# Patient Record
Sex: Male | Born: 1985 | Race: Black or African American | Hispanic: No | Marital: Single | Smoking: Current every day smoker
Health system: Southern US, Community
[De-identification: ages and names within clinical notes are randomized; demographics above are authoritative.]

---
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
## 2018-03-09 ENCOUNTER — Encounter (HOSPITAL_COMMUNITY): Payer: Self-pay | Admitting: *Deleted

## 2018-03-09 ENCOUNTER — Ambulatory Visit (HOSPITAL_COMMUNITY)
Admission: EM | Admit: 2018-03-09 | Discharge: 2018-03-09 | Disposition: A | Discharge status: Home / Self Care | Payer: Self-pay | Attending: Family Medicine | Admitting: Family Medicine

## 2018-03-09 DIAGNOSIS — S39012A Strain of muscle, fascia and tendon of lower back, initial encounter: Secondary | ICD-10-CM

## 2018-03-09 MED ORDER — MELOXICAM 15 MG PO TABS: 15.0000 mg | ORAL_TABLET | Freq: Every day | ORAL | 1 refills | Status: AC

## 2018-03-09 MED ORDER — CYCLOBENZAPRINE HCL 5 MG PO TABS: 5.0000 mg | ORAL_TABLET | Freq: Every day | ORAL | 0 refills | Status: AC
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
## 2020-02-15 ENCOUNTER — Emergency Department (HOSPITAL_COMMUNITY): Payer: No Typology Code available for payment source

## 2020-02-15 ENCOUNTER — Emergency Department (HOSPITAL_COMMUNITY)
Admission: EM | Admit: 2020-02-15 | Discharge: 2020-02-16 | Disposition: A | Discharge status: Home / Self Care | Payer: No Typology Code available for payment source | Attending: Emergency Medicine | Admitting: Emergency Medicine

## 2020-02-15 ENCOUNTER — Other Ambulatory Visit: Payer: Self-pay

## 2020-02-15 ENCOUNTER — Encounter (HOSPITAL_COMMUNITY): Payer: Self-pay

## 2020-02-15 DIAGNOSIS — Z23 Encounter for immunization: Secondary | ICD-10-CM | POA: Diagnosis not present

## 2020-02-15 DIAGNOSIS — Y939 Activity, unspecified: Secondary | ICD-10-CM | POA: Insufficient documentation

## 2020-02-15 DIAGNOSIS — W19XXXA Unspecified fall, initial encounter: Secondary | ICD-10-CM | POA: Insufficient documentation

## 2020-02-15 DIAGNOSIS — R52 Pain, unspecified: Secondary | ICD-10-CM

## 2020-02-15 DIAGNOSIS — F17200 Nicotine dependence, unspecified, uncomplicated: Secondary | ICD-10-CM | POA: Diagnosis not present

## 2020-02-15 DIAGNOSIS — Y929 Unspecified place or not applicable: Secondary | ICD-10-CM | POA: Diagnosis not present

## 2020-02-15 DIAGNOSIS — Z79899 Other long term (current) drug therapy: Secondary | ICD-10-CM | POA: Diagnosis not present

## 2020-02-15 DIAGNOSIS — Y999 Unspecified external cause status: Secondary | ICD-10-CM | POA: Diagnosis not present

## 2020-02-15 DIAGNOSIS — S61212A Laceration without foreign body of right middle finger without damage to nail, initial encounter: Secondary | ICD-10-CM

## 2020-02-15 MED ORDER — LIDOCAINE-EPINEPHRINE 2 %-1:100000 IJ SOLN
20.0000 mL | Freq: Once | INTRAMUSCULAR | Status: DC
  Filled 2020-02-15: qty 1

## 2020-02-15 MED ORDER — TETANUS-DIPHTH-ACELL PERTUSSIS 5-2.5-18.5 LF-MCG/0.5 IM SUSP
0.5000 mL | Freq: Once | INTRAMUSCULAR | Status: AC
  Administered 2020-02-16: 0.5 mL via INTRAMUSCULAR
  Filled 2020-02-15 (×2): qty 0.5

## 2020-02-15 NOTE — ED Provider Notes (Signed)
[facility]-EMERGENCY DEPT Provider Note   CSN: [id number] Arrival date & time: 02/15/20  2123     History Chief Complaint  Patient presents with  . Laceration    Zachary Reyes is a 34 y.o. male  The history is provided by the patient. No language interpreter was used.  Hand Injury Location:  Hand and finger Hand location:  R hand Finger location:  R middle finger Injury: yes   Time since incident:  3 hours Mechanism of injury: fall   Fall:    Fall occurred:  Standing   Impact surface:  Hard floor   Point of impact:  Hands Pain details:    Radiates to:  Does not radiate   Severity:  Moderate   Onset quality:  Sudden   Timing:  Constant Handedness:  Right-handed Dislocation: no   Foreign body present:  No foreign bodies Tetanus status:  Unknown Prior injury to area:  Yes Relieved by:  None tried Worsened by:  Movement Ineffective treatments:  None tried Associated symptoms: decreased range of motion   Associated symptoms: no back pain, no fatigue, no fever, no muscle weakness, no neck pain, no numbness, no stiffness, no swelling and no tingling        History reviewed. No pertinent past medical history.  There are no problems to display for this patient.   History reviewed. No pertinent surgical history.     Family History  Problem Relation Age of Onset  . Healthy Mother   . Healthy Father   . Diabetes Maternal Grandmother     Social History   Tobacco Use  . Smoking status: Current Every Day Smoker  . Smokeless tobacco: Never Used  Substance Use Topics  . Alcohol use: Yes    Comment: occasionally  . Drug use: Yes    Types: Marijuana    Home Medications Prior to Admission medications   Medication Sig Start Date End Date Taking? Authorizing Provider  cyclobenzaprine (FLEXERIL) 5 MG tablet Take 1 tablet (5 mg total) by mouth at bedtime. 03/09/18   Elvina Sidle, MD  meloxicam (MOBIC) 15 MG tablet Take 1 tablet (15 mg  total) by mouth daily. 03/09/18   Elvina Sidle, MD    Allergies    Patient has no known allergies.  Review of Systems   Review of Systems  Constitutional: Negative for fatigue and fever.  Musculoskeletal: Negative for back pain, neck pain and stiffness.    Physical Exam Updated Vital Signs BP 115/89 (BP Location: Left Arm)   Pulse 66   Temp 99.1 F (37.3 C) (Oral)   Resp 16   Ht 5\' 11"  (1.803 m)   Wt 63.5 kg   SpO2 100%   BMI 19.53 kg/m   Physical Exam Vitals and nursing note reviewed.  Constitutional:      General: He is not in acute distress.    Appearance: He is well-developed. He is not diaphoretic.  HENT:     Head: Normocephalic and atraumatic.  Eyes:     General: No scleral icterus.    Conjunctiva/sclera: Conjunctivae normal.  Cardiovascular:     Rate and Rhythm: Normal rate and regular rhythm.     Heart sounds: Normal heart sounds.  Pulmonary:     Effort: Pulmonary effort is normal. No respiratory distress.     Breath sounds: Normal breath sounds.  Abdominal:     Palpations: Abdomen is soft.     Tenderness: There is no abdominal tenderness.  Musculoskeletal:  Cervical back: Normal range of motion and neck supple.     Comments: R middle finger with 1 cm laceration  Finger is sitting in slight flexion at the MCP Normal sensation  Skin:    General: Skin is warm and dry.  Neurological:     Mental Status: He is alert.  Psychiatric:        Behavior: Behavior normal.     ED Results / Procedures / Treatments   Labs (all labs ordered are listed, but only abnormal results are displayed) Labs Reviewed - No data to display  EKG None  Radiology No results found.  Procedures .Marland KitchenLaceration Repair  Date/Time: 02/15/2020 11:44 PM Performed by: Arthor Captain, PA-C Authorized by: Arthor Captain, PA-C   Consent:    Consent obtained:  Verbal   Consent given by:  Patient   Risks discussed:  Infection, need for additional repair, pain, poor  cosmetic result and poor wound healing   Alternatives discussed:  No treatment and delayed treatment Universal protocol:    Procedure explained and questions answered to patient or proxy's satisfaction: yes     Relevant documents present and verified: yes     Test results available and properly labeled: yes     Imaging studies available: yes     Required blood products, implants, devices, and special equipment available: yes     Site/side marked: yes     Immediately prior to procedure, a time out was called: yes     Patient identity confirmed:  Verbally with patient Anesthesia (see MAR for exact dosages):    Anesthesia method:  Local infiltration   Local anesthetic:  Lidocaine 1% w/o epi Laceration details:    Location:  Finger   Finger location:  R long finger   Length (cm):  1 Repair type:    Repair type:  Simple Pre-procedure details:    Preparation:  Patient was prepped and draped in usual sterile fashion Exploration:    Wound exploration: wound explored through full range of motion and entire depth of wound probed and visualized   Treatment:    Area cleansed with:  Betadine   Amount of cleaning:  Standard   Irrigation solution:  Sterile saline Skin repair:    Repair method:  Sutures   Suture size:  5-0   Wound skin closure material used: vicryl rapide.   Suture technique:  Simple interrupted and horizontal mattress   Number of sutures:  2 Approximation:    Approximation:  Close Post-procedure details:    Dressing:  Open (no dressing)   Patient tolerance of procedure:  Tolerated well, no immediate complications   (including critical care time)  Medications Ordered in ED Medications  Tdap (BOOSTRIX) injection 0.5 mL (has no administration in time range)    ED Course  I have reviewed the triage vital signs and the nursing notes.  Pertinent labs & imaging results that were available during my care of the patient were reviewed by me and considered in my medical  decision making (see chart for details).    MDM Rules/Calculators/A&P                      Zachary Reyes is a 34 y.o. male who presents to ED for laceration of R middle finger. Wound thoroughly cleaned in ED today. Wound explored and bottom of wound seen in a bloodless field. Laceration repaired as dictated above. Patient counseled on home wound care.  I reviewed Images of the R hand  and see no abnormalities. Expect negative xray, this will be followed for official read by PA Khatri.  Patient was urged to return to the Emergency Department for worsening pain, swelling, expanding erythema especially if it streaks away from the affected area, fever, or for any additional concerns. Patient verbalized understanding. All questions answered.  Final Clinical Impression(s) / ED Diagnoses Final diagnoses:  Laceration of right middle finger without foreign body without damage to nail, initial encounter    Rx / DC Orders ED Discharge Orders    None       Margarita Mail, PA-C 02/15/20 2358    Truddie Hidden, MD 02/16/20 1040

## 2020-02-15 NOTE — ED Triage Notes (Signed)
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

## 2020-02-15 NOTE — Discharge Instructions (Addendum)
WOUND CARE Your stiches are dissolvable and should fall out on their own in 1 week.   Keep area clean and dry for 24 hours. Do not remove bandage, if applied.  After 24 hours, remove bandage and wash wound gently with mild soap and warm water. Reapply a new bandage after cleaning wound, if directed.  Continue daily cleansing with soap and water until stitches/staples are removed.  Do not apply any ointments or creams to the wound while stitches/staples are in place, as this may cause delayed healing.  Seek medical careif you experience any of the following signs of infection: Swelling, redness, pus drainage, streaking, fever >101.0 F  Seek care if you experience excessive bleeding that does not stop after 15-20 minutes of constant, firm pressure.

## 2020-02-16 NOTE — ED Provider Notes (Signed)
  Physical Exam  BP 115/89 (BP Location: Left Arm)   Pulse 66   Temp 99.1 F (37.3 C) (Oral)   Resp 16   Ht 5\' 11"  (1.803 m)   Wt 63.5 kg   SpO2 100%   BMI 19.53 kg/m   Physical Exam  ED Course/Procedures     Procedures  MDM  Patient informed of unremarkable x-ray.  He is ready for discharge.  Given AVS.       , PA-C 02/16/20 0025    04/17/20, MD 02/16/20 [phone]

## 2020-02-16 NOTE — ED Notes (Signed)
Pt declined discharge vitals

## 2021-12-24 ENCOUNTER — Encounter (HOSPITAL_COMMUNITY): Payer: Self-pay

## 2021-12-24 ENCOUNTER — Emergency Department (HOSPITAL_COMMUNITY): Payer: Self-pay

## 2021-12-24 ENCOUNTER — Emergency Department (HOSPITAL_COMMUNITY)
Admission: EM | Admit: 2021-12-24 | Discharge: 2021-12-25 | Discharge status: Against Medical Advice | Payer: Self-pay | Attending: Emergency Medicine | Admitting: Emergency Medicine

## 2021-12-24 ENCOUNTER — Other Ambulatory Visit: Payer: Self-pay

## 2021-12-24 DIAGNOSIS — S8991XA Unspecified injury of right lower leg, initial encounter: Secondary | ICD-10-CM | POA: Insufficient documentation

## 2021-12-24 DIAGNOSIS — Y92410 Unspecified street and highway as the place of occurrence of the external cause: Secondary | ICD-10-CM | POA: Insufficient documentation

## 2021-12-24 DIAGNOSIS — S4992XA Unspecified injury of left shoulder and upper arm, initial encounter: Secondary | ICD-10-CM | POA: Insufficient documentation

## 2021-12-24 DIAGNOSIS — Z5321 Procedure and treatment not carried out due to patient leaving prior to being seen by health care provider: Secondary | ICD-10-CM | POA: Diagnosis not present
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
